# Patient Record
Sex: Male | Born: 1935 | Race: White | Hispanic: No | Marital: Married | Smoking: Never smoker
Health system: Southern US, Community
[De-identification: ages and names within clinical notes are randomized; demographics above are authoritative.]

## PROBLEM LIST (undated history)

## (undated) DIAGNOSIS — I10 Essential (primary) hypertension: Secondary | ICD-10-CM

## (undated) DIAGNOSIS — I639 Cerebral infarction, unspecified: Secondary | ICD-10-CM

## (undated) DIAGNOSIS — I2510 Atherosclerotic heart disease of native coronary artery without angina pectoris: Secondary | ICD-10-CM

---

## 2005-09-17 ENCOUNTER — Ambulatory Visit: Payer: Self-pay | Admitting: Internal Medicine

## 2007-05-20 ENCOUNTER — Ambulatory Visit: Payer: Self-pay | Admitting: Ophthalmology

## 2007-05-20 ENCOUNTER — Other Ambulatory Visit: Payer: Self-pay

## 2008-05-25 ENCOUNTER — Ambulatory Visit: Payer: Self-pay | Admitting: Internal Medicine

## 2012-08-23 ENCOUNTER — Ambulatory Visit: Payer: Self-pay | Admitting: Internal Medicine

## 2012-08-23 LAB — CBC WITH DIFFERENTIAL/PLATELET
Basophil #: 0 10*3/uL (ref 0.0–0.1)
Basophil %: 0.4 %
HCT: 41.3 % (ref 40.0–52.0)
HGB: 14.2 g/dL (ref 13.0–18.0)
Lymphocyte #: 0.9 10*3/uL — ABNORMAL LOW (ref 1.0–3.6)
Lymphocyte %: 9.4 %
MCH: 34.7 pg — ABNORMAL HIGH (ref 26.0–34.0)
MCV: 101 fL — ABNORMAL HIGH (ref 80–100)
Monocyte #: 0.9 x10 3/mm (ref 0.2–1.0)
Monocyte %: 10.1 %
Neutrophil #: 7.2 10*3/uL — ABNORMAL HIGH (ref 1.4–6.5)
Neutrophil %: 79.5 %
RBC: 4.08 10*6/uL — ABNORMAL LOW (ref 4.40–5.90)
RDW: 13.5 % (ref 11.5–14.5)

## 2012-08-23 LAB — URINALYSIS, COMPLETE
Bacteria: NEGATIVE
Bilirubin,UR: NEGATIVE
Ketone: NEGATIVE
Leukocyte Esterase: NEGATIVE
Nitrite: NEGATIVE
Ph: 6 (ref 4.5–8.0)
Specific Gravity: >=1.03 (ref 1.003–1.030)

## 2012-08-23 LAB — COMPREHENSIVE METABOLIC PANEL
Albumin: 4 g/dL (ref 3.4–5.0)
Alkaline Phosphatase: 62 U/L (ref 50–136)
BUN: 20 mg/dL — ABNORMAL HIGH (ref 7–18)
Bilirubin,Total: 0.9 mg/dL (ref 0.2–1.0)
Calcium, Total: 9.6 mg/dL (ref 8.5–10.1)
Co2: 25 mmol/L (ref 21–32)
Creatinine: 1.32 mg/dL — ABNORMAL HIGH (ref 0.60–1.30)
EGFR (Non-African Amer.): 49 — ABNORMAL LOW
Potassium: 4.1 mmol/L (ref 3.5–5.1)
SGPT (ALT): 29 U/L (ref 12–78)
Sodium: 143 mmol/L (ref 136–145)
Total Protein: 7 g/dL (ref 6.4–8.2)

## 2013-06-19 ENCOUNTER — Ambulatory Visit: Payer: Self-pay | Admitting: Internal Medicine

## 2013-06-19 LAB — COMPREHENSIVE METABOLIC PANEL
ALBUMIN: 3.5 g/dL (ref 3.4–5.0)
ALK PHOS: 47 U/L
AST: 21 U/L (ref 15–37)
Anion Gap: 12 (ref 7–16)
BILIRUBIN TOTAL: 0.7 mg/dL (ref 0.2–1.0)
BUN: 20 mg/dL — ABNORMAL HIGH (ref 7–18)
CHLORIDE: 103 mmol/L (ref 98–107)
CREATININE: 1.12 mg/dL (ref 0.60–1.30)
Calcium, Total: 9 mg/dL (ref 8.5–10.1)
Co2: 23 mmol/L (ref 21–32)
GFR CALC NON AF AMER: 59 — AB
Glucose: 190 mg/dL — ABNORMAL HIGH (ref 65–99)
OSMOLALITY: 283 (ref 275–301)
POTASSIUM: 3.9 mmol/L (ref 3.5–5.1)
SGPT (ALT): 27 U/L (ref 12–78)
SODIUM: 138 mmol/L (ref 136–145)
Total Protein: 6.5 g/dL (ref 6.4–8.2)

## 2013-06-19 LAB — CBC WITH DIFFERENTIAL/PLATELET
BASOS ABS: 0 10*3/uL (ref 0.0–0.1)
Basophil %: 0.6 %
Eosinophil #: 0.1 10*3/uL (ref 0.0–0.7)
Eosinophil %: 1.8 %
HCT: 40.5 % (ref 40.0–52.0)
HGB: 13.5 g/dL (ref 13.0–18.0)
LYMPHS ABS: 0.9 10*3/uL — AB (ref 1.0–3.6)
Lymphocyte %: 14.6 %
MCH: 34.7 pg — AB (ref 26.0–34.0)
MCHC: 33.3 g/dL (ref 32.0–36.0)
MCV: 104 fL — AB (ref 80–100)
MONOS PCT: 11 %
Monocyte #: 0.6 x10 3/mm (ref 0.2–1.0)
NEUTROS PCT: 72 %
Neutrophil #: 4.2 10*3/uL (ref 1.4–6.5)
Platelet: 122 10*3/uL — ABNORMAL LOW (ref 150–440)
RBC: 3.89 10*6/uL — AB (ref 4.40–5.90)
RDW: 13.5 % (ref 11.5–14.5)
WBC: 5.9 10*3/uL (ref 3.8–10.6)

## 2013-06-19 LAB — MAGNESIUM: MAGNESIUM: 2 mg/dL

## 2014-03-01 ENCOUNTER — Ambulatory Visit: Payer: Self-pay | Admitting: Internal Medicine

## 2014-03-01 LAB — CBC WITH DIFFERENTIAL/PLATELET
Basophil #: 0 10*3/uL (ref 0.0–0.1)
Basophil %: 0.6 %
EOS PCT: 1.6 %
Eosinophil #: 0.1 10*3/uL (ref 0.0–0.7)
HCT: 43.9 % (ref 40.0–52.0)
HGB: 14.5 g/dL (ref 13.0–18.0)
LYMPHS ABS: 0.9 10*3/uL — AB (ref 1.0–3.6)
Lymphocyte %: 12.9 %
MCH: 34 pg (ref 26.0–34.0)
MCHC: 33.1 g/dL (ref 32.0–36.0)
MCV: 103 fL — ABNORMAL HIGH (ref 80–100)
MONO ABS: 1.1 x10 3/mm — AB (ref 0.2–1.0)
Monocyte %: 15.7 %
Neutrophil #: 5.1 10*3/uL (ref 1.4–6.5)
Neutrophil %: 69.2 %
PLATELETS: 115 10*3/uL — AB (ref 150–440)
RBC: 4.27 10*6/uL — AB (ref 4.40–5.90)
RDW: 13.9 % (ref 11.5–14.5)
WBC: 7.3 10*3/uL (ref 3.8–10.6)

## 2015-02-02 ENCOUNTER — Ambulatory Visit
Admission: EM | Admit: 2015-02-02 | Discharge: 2015-02-02 | Disposition: A | Discharge status: Home / Self Care | Payer: Medicare Other | Attending: Family Medicine | Admitting: Family Medicine

## 2015-02-02 ENCOUNTER — Ambulatory Visit: Payer: Medicare Other

## 2015-02-02 ENCOUNTER — Encounter: Payer: Self-pay | Admitting: Emergency Medicine

## 2015-02-02 DIAGNOSIS — K5900 Constipation, unspecified: Secondary | ICD-10-CM | POA: Diagnosis not present

## 2015-02-02 HISTORY — DX: Cerebral infarction, unspecified: I63.9

## 2015-02-02 HISTORY — DX: Essential (primary) hypertension: I10

## 2015-02-02 HISTORY — DX: Atherosclerotic heart disease of native coronary artery without angina pectoris: I25.10

## 2015-02-02 LAB — OCCULT BLOOD X 1 CARD TO LAB, STOOL: Fecal Occult Bld: NEGATIVE

## 2015-02-02 NOTE — ED Notes (Signed)
Pt states "a week ago I started throwing up and then I had diarrhea. I flew home Saturday, I felt better, now a week later I have not had a bowel movement. I have been eating normal and drinking normal. I am urinating normal, but no bowel movement for 6 days. I do not have pain." Pt looks well in color, no distress.

## 2015-02-02 NOTE — Discharge Instructions (Signed)
Drink water regularly. Follow up with your primary care physician next week.   Return to Urgent care for new or worsening concerns.   Constipation, Adult Constipation is when a person has fewer than three bowel movements a week, has difficulty having a bowel movement, or has stools that are dry, hard, or larger than normal. As people grow older, constipation is more common. A low-fiber diet, not taking in enough fluids, and taking certain medicines may make constipation worse.  CAUSES   Certain medicines, such as antidepressants, pain medicine, iron supplements, antacids, and water pills.   Certain diseases, such as diabetes, irritable bowel syndrome (IBS), thyroid disease, or depression.   Not drinking enough water.   Not eating enough fiber-rich foods.   Stress or travel.   Lack of physical activity or exercise.   Ignoring the urge to have a bowel movement.   Using laxatives too much.  SIGNS AND SYMPTOMS   Having fewer than three bowel movements a week.   Straining to have a bowel movement.   Having stools that are hard, dry, or larger than normal.   Feeling full or bloated.   Pain in the lower abdomen.   Not feeling relief after having a bowel movement.  DIAGNOSIS  Your health care provider will take a medical history and perform a physical exam. Further testing may be done for severe constipation. Some tests may include:  A barium enema X-ray to examine your rectum, colon, and, sometimes, your small intestine.   A sigmoidoscopy to examine your lower colon.   A colonoscopy to examine your entire colon. TREATMENT  Treatment will depend on the severity of your constipation and what is causing it. Some dietary treatments include drinking more fluids and eating more fiber-rich foods. Lifestyle treatments may include regular exercise. If these diet and lifestyle recommendations do not help, your health care provider may recommend taking over-the-counter  laxative medicines to help you have bowel movements. Prescription medicines may be prescribed if over-the-counter medicines do not work.  HOME CARE INSTRUCTIONS   Eat foods that have a lot of fiber, such as fruits, vegetables, whole grains, and beans.  Limit foods high in fat and processed sugars, such as french fries, hamburgers, cookies, candies, and soda.   A fiber supplement may be added to your diet if you cannot get enough fiber from foods.   Drink enough fluids to keep your urine clear or pale yellow.   Exercise regularly or as directed by your health care provider.   Go to the restroom when you have the urge to go. Do not hold it.   Only take over-the-counter or prescription medicines as directed by your health care provider. Do not take other medicines for constipation without talking to your health care provider first.  SEEK IMMEDIATE MEDICAL CARE IF:   You have bright red blood in your stool.   Your constipation lasts for more than 4 days or gets worse.   You have abdominal or rectal pain.   You have thin, pencil-like stools.   You have unexplained weight loss. MAKE SURE YOU:   Understand these instructions.  Will watch your condition.  Will get help right away if you are not doing well or get worse.   This information is not intended to replace advice given to you by your health care provider. Make sure you discuss any questions you have with your health care provider.   Document Released: 01/11/2004 Document Revised: 05/05/2014 Document Reviewed: 01/24/2013 Elsevier Interactive Patient  Education 2016 Else[employer] Inc.  High-Fiber Diet Fiber, also called dietary fiber, is a type of carbohydrate found in fruits, vegetables, whole grains, and beans. A high-fiber diet can have many health benefits. Your health care provider may recommend a high-fiber diet to help:  Prevent constipation. Fiber can make your bowel movements more regular.  Lower your  cholesterol.  Relieve hemorrhoids, uncomplicated diverticulosis, or irritable bowel syndrome.  Prevent overeating as part of a weight-loss plan.  Prevent heart disease, type 2 diabetes, and certain cancers. WHAT IS MY PLAN? The recommended daily intake of fiber includes:  38 grams for men under age 63.  30 grams for men over age 21.  25 grams for women under age 79.  21 grams for women over age 48. You can get the recommended daily intake of dietary fiber by eating a variety of fruits, vegetables, grains, and beans. Your health care provider may also recommend a fiber supplement if it is not possible to get enough fiber through your diet. WHAT DO I NEED TO KNOW ABOUT A HIGH-FIBER DIET?  Fiber supplements have not been widely studied for their effectiveness, so it is better to get fiber through food sources.  Always check the fiber content on thenutrition facts label of any prepackaged food. Look for foods that contain at least 5 grams of fiber per serving.  Ask your dietitian if you have questions about specific foods that are related to your condition, especially if those foods are not listed in the following section.  Increase your daily fiber consumption gradually. Increasing your intake of dietary fiber too quickly may cause bloating, cramping, or gas.  Drink plenty of water. Water helps you to digest fiber. WHAT FOODS CAN I EAT? Grains Whole-grain breads. Multigrain cereal. Oats and oatmeal. Brown rice. Barley. Bulgur wheat. Millet. Bran muffins. Popcorn. Rye wafer crackers. Vegetables Sweet potatoes. Spinach. Kale. Artichokes. Cabbage. Broccoli. Green peas. Carrots. Squash. Fruits Berries. Pears. Apples. Oranges. Avocados. Prunes and raisins. Dried figs. Meats and Other Protein Sources Navy, kidney, pinto, and soy beans. Split peas. Lentils. Nuts and seeds. Dairy Fiber-fortified yogurt. Beverages Fiber-fortified soy milk. Fiber-fortified orange juice. Other Fiber  bars. The items listed above may not be a complete list of recommended foods or beverages. Contact your dietitian for more options. WHAT FOODS ARE NOT RECOMMENDED? Grains White bread. Pasta made with refined flour. White rice. Vegetables Fried potatoes. Canned vegetables. Well-cooked vegetables.  Fruits Fruit juice. Cooked, strained fruit. Meats and Other Protein Sources Fatty cuts of meat. Fried Environmental education officer or fried fish. Dairy Milk. Yogurt. Cream cheese. Sour cream. Beverages Soft drinks. Other Cakes and pastries. Butter and oils. The items listed above may not be a complete list of foods and beverages to avoid. Contact your dietitian for more information. WHAT ARE SOME TIPS FOR INCLUDING HIGH-FIBER FOODS IN MY DIET?  Eat a wide variety of high-fiber foods.  Make sure that half of all grains consumed each day are whole grains.  Replace breads and cereals made from refined flour or white flour with whole-grain breads and cereals.  Replace white rice with brown rice, bulgur wheat, or millet.  Start the day with a breakfast that is high in fiber, such as a cereal that contains at least 5 grams of fiber per serving.  Use beans in place of meat in soups, salads, or pasta.  Eat high-fiber snacks, such as berries, raw vegetables, nuts, or popcorn.   This information is not intended to replace advice given to you by your health  care provider. Make sure you discuss any questions you have with your health care provider.   Document Released: 04/14/2005 Document Revised: 05/05/2014 Document Reviewed: 09/27/2013 Elsevier Interactive Patient Education [employer]

## 2015-02-02 NOTE — ED Provider Notes (Signed)
[facility] Emergency Department Provider Note  ____________________________________________  Time seen: Approximately 1:15 PM  I have reviewed the triage vital signs and the nursing notes.   HISTORY  Chief Complaint Constipation    HPI Aaron Sparks is a 81 y.o. male presents with a complaint of constipation. Patient reports that he is been constipated 6 days. States this past Friday and Saturday he was traveling in [place] at a conference and states that he felt that he ate something but it did not agree with him. Patient states that while at the conference he had several episodes of diarrhea as well as 2-3 episodes of vomiting. States that those symptoms quickly resolved within a day. States he came home Sunday. States since Sunday has been eating and drinking well. States that he is eating his normal amount of foods. Denies any appetite changes. States however he has not had a bowel movement. States that he normally has a bowel movement every other day. States the vomiting and diarrhea was normal colored, and states he was seen by a physician at the conference who confirmed the vomiting as well as stool were negative for blood present.   Patient does report that after having the diarrhea episodes he stopped his Colace stool softener and did not take it again until yesterday. Patient states that he does continue to pass gas. Denies nausea or vomiting since last week. Denies abdominal pain, bloating or other discomfort. Reports continues to urinate well and denies urinary stream changes. Denies rectal pain. Denies rectal bleeding. Denies fever.  Denies other recent sickness. Denies pain.    Past Medical History  Diagnosis Date  . Hypertension   . Coronary artery disease   . TIA     There are no active problems to display for this patient.   History reviewed. No pertinent past surgical history.  Current Outpatient Rx  Name  Route  Sig  Dispense   Refill  . aspirin 81 MG chewable tablet   Oral   Chew by mouth daily.         . calcium citrate (CALCITRATE - DOSED IN MG ELEMENTAL CALCIUM) 950 MG tablet   Oral   Take 200 mg of elemental calcium by mouth daily.         . clopidogrel (PLAVIX) 75 MG tablet   Oral   Take 75 mg by mouth daily.         . simvastatin (ZOCOR) 20 MG tablet   Oral   Take 20 mg by mouth daily.         . vitamin B-12 (CYANOCOBALAMIN) 100 MCG tablet   Oral   Take 500 mcg by mouth daily.         . Vitamin D, Ergocalciferol, (DRISDOL) 50000 UNITS CAPS capsule   Oral   Take 50,000 Units by mouth every 30 (thirty) days.         Colace   Allergies Review of patient's allergies indicates no known allergies.  No family history on file.  Social History Social History  Substance Use Topics  . Smoking status: Never Smoker   . Smokeless tobacco: None  . Alcohol Use: Yes    Review of Systems Constitutional: No fever/chills Eyes: No visual changes. ENT: No sore throat. Cardiovascular: Denies chest pain. Respiratory: Denies shortness of breath. Gastrointestinal: No abdominal pain.  No nausea, no vomiting.  No diarrhea.  Positive constipation. Genitourinary: Negative for dysuria. Musculoskeletal: Negative for back pain. Skin: Negative for rash. Neurological: Negative  for headaches, focal weakness or numbness.  10-point ROS otherwise negative.  ____________________________________________   PHYSICAL EXAM:  VITAL SIGNS: ED Triage Vitals  Enc Vitals Group     BP 02/02/15 1217 145/72 mmHg     Pulse Rate 02/02/15 1217 67     Resp 02/02/15 1217 18     Temp 02/02/15 1217 97.9 F (36.6 C)     Temp Source 02/02/15 1217 Oral     SpO2 02/02/15 1217 100 %     Weight 02/02/15 1217 165 lb (74.844 kg)     Height 02/02/15 1217  (1.778 m)     Head Cir --      Peak Flow --      Pain Score --      Pain Loc --      Pain Edu? --      Excl. in GC? --     Constitutional: Alert and  oriented. Well appearing and in no acute distress. Eyes: Conjunctivae are normal. PERRL. EOMI. Head: Atraumatic.  Nose: No congestion/rhinnorhea.  Mouth/Throat: Mucous membranes are moist.  Oropharynx non-erythematous. Neck: No stridor.  No cervical spine tenderness to palpation. Hematological/Lymphatic/Immunilogical: No cervical lymphadenopathy. Cardiovascular: Normal rate, regular rhythm. Grossly normal heart sounds.  Good peripheral circulation. Respiratory: Normal respiratory effort.  No retractions. Lungs CTAB. Gastrointestinal: Soft and nontender. No distention. Normal Bowel sounds.  No abdominal bruits. No CVA tenderness. Rectal exam: completed with Myriam Jacobson, CMA at bedside. No external hemorrhoids, no rash. Prostate nontender. Normal colored stool. No impaction felt. Patient tolerated well.  Musculoskeletal: No lower or upper extremity tenderness nor edema.  No joint effusions. Bilateral pedal pulses equal and easily palpated.  Neurologic:  Normal speech and language. No gross focal neurologic deficits are appreciated. No gait instability. Skin:   No rash noted. Psychiatric: Mood and affect are normal. Speech and behavior are normal.  ____________________________________________   LABS (all labs ordered are listed, but only abnormal results are displayed)  Labs Reviewed  OCCULT BLOOD X 1 CARD TO LAB, STOOL    RADIOLOGY EXAM: ABDOMEN - 1 VIEW  COMPARISON: CT abdomen and pelvis August 23, 2012  FINDINGS: There is fairly diffuse stool throughout the colon. Colon is not distended with stool, however. The bowel gas pattern is unremarkable without obstruction or free air. There are phleboliths in the pelvis as well as prostatic calculi. There is periarticular osteoporosis.  IMPRESSION: Fairly diffuse stool throughout the colon. No obstruction or free air seen on this supine examination. Calcifications in the lower midline pelvis likely reside within the  prostate.   Electronically Signed By: Bretta Bang III M.D. On: 02/02/2015 13:13  I, Renford Dills, personally viewed and evaluated these images (plain radiographs) as part of my medical decision making.     INITIAL IMPRESSION / ASSESSMENT AND PLAN / ED COURSE  Pertinent labs & imaging results that were available during my care of the patient were reviewed by me and considered in my medical decision making (see chart for details).  Discussed this patient and plan of care with Dr Judd Gaudier who also agrees with plan.   Very well appearing patient, no acute distress. Presents for complaint of constipation x 6 days. Prior to constipation patient had some diarrhea and vomiting which he attributed to food, and reports those symptoms quickly resolved. States after diarrhea he then stopped his daily stool softener colace and didn't resume until yesterday. Denies nausea, vomiting, diarrhea. Denies abdominal pain, distention, or discomfort. Reports continues to pass flatus. Hemoccult negative. Normal  bowel sounds. Abdomen soft and nontender.   Per radiology:KUB abdomen with fairly diffuse stool throughout colon; No obstruction or free air seen on this supine examination. NO impaction seen on xray or palpated on rectal exam . As patient just resumed his stool softener yesterday, counseled to continue. Also counseled regarding water intact as well as fiber intake. Discussed use of OTC fiber supplements to assist. Discussed metamucil to use, and patient reports his wife has some at home. Patient to continue colace, metamucil, water intake, and discussed fiber food choices to help constipation. Discussed follow up with Primary care physician this week. Discussed follow up and return parameters including no resolution or any worsening concerns. Patient verbalized understanding and agreed to plan.   ____________________________________________   FINAL CLINICAL IMPRESSION(S) / ED DIAGNOSES  Final  diagnoses:  Constipation, unspecified constipation type       Renford Dills, NP 02/02/15 1402  Renford Dills, NP 02/02/15 1610

## 2015-04-28 ENCOUNTER — Ambulatory Visit
Admission: EM | Admit: 2015-04-28 | Discharge: 2015-04-28 | Disposition: A | Discharge status: Home / Self Care | Payer: Medicare Other | Attending: Family Medicine | Admitting: Family Medicine

## 2015-04-28 ENCOUNTER — Encounter: Payer: Self-pay | Admitting: Emergency Medicine

## 2015-04-28 DIAGNOSIS — M5417 Radiculopathy, lumbosacral region: Secondary | ICD-10-CM | POA: Diagnosis not present

## 2015-04-28 DIAGNOSIS — M5432 Sciatica, left side: Secondary | ICD-10-CM

## 2015-04-28 DIAGNOSIS — M21372 Foot drop, left foot: Secondary | ICD-10-CM

## 2015-04-28 NOTE — ED Notes (Signed)
Left leg pain for 10 days

## 2015-04-28 NOTE — ED Provider Notes (Signed)
CSN: [id number]     Arrival date & time 04/28/15  1130 History   First MD Initiated Contact with Patient 04/28/15 1147     Chief Complaint  Patient presents with  . Leg Pain   (Consider location/radiation/quality/duration/timing/severity/associated sxs/prior Treatment) HPI   This a very pleasant active 81 year old gentleman who is accompanied by his wife. He presents with a left-sided leg pain started approximately 10 days ago but became worse today. 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 He has several trips yearly and has stopped growing internationally. He recently returned about 3 weeks ago from a trip to [place] lasting 3 days. He does not remember specifically injuring himself but noticed that shortly afterwards began to experience the left leg pain where he indicates begins in the left buttock cheek will radiate into his left thigh calf and ankle but varies in intensity and location. It is worse with ambulation and is relieved by sitting or rest. He is use K cane today for ambulation. Although he denies any incontinence he has been using a pad recently for some leakage of bowel. Coughing sneezing or straining at bowel movement does not increase his discomfort. He states that he normally would have numbness in his leg and previously diagnosed radiculopathy and a left foot drop but the numbness has now turned into the pain.  Past Medical History  Diagnosis Date  . Hypertension   . Coronary artery disease   . CVA (cerebral infarction)    History reviewed. No pertinent past surgical history. No family history on file. Social History  Substance Use Topics  . Smoking status: Never Smoker   . Smokeless tobacco: None  . Alcohol Use: Yes    Review of Systems  Constitutional: Positive for activity change. Negative for fever, chills, diaphoresis and fatigue.  Musculoskeletal: Positive for myalgias and gait problem.  Skin:  Negative for color change and rash.  Neurological: Positive for numbness.    Allergies  Review of patient's allergies indicates no known allergies.  Home Medications   Prior to Admission medications   Medication Sig Start Date End Date Taking? Authorizing Provider  aspirin 81 MG chewable tablet Chew by mouth daily.    Historical Provider, MD  calcium citrate (CALCITRATE - DOSED IN MG ELEMENTAL CALCIUM) 950 MG tablet Take 200 mg of elemental calcium by mouth daily.    Historical Provider, MD  clopidogrel (PLAVIX) 75 MG tablet Take 75 mg by mouth daily.    Historical Provider, MD  simvastatin (ZOCOR) 20 MG tablet Take 20 mg by mouth daily.    Historical Provider, MD  vitamin B-12 (CYANOCOBALAMIN) 100 MCG tablet Take 500 mcg by mouth daily.    Historical Provider, MD  Vitamin D, Ergocalciferol, (DRISDOL) 50000 UNITS CAPS capsule Take 50,000 Units by mouth every 30 (thirty) days.    Historical Provider, MD   Meds Ordered and Administered this Visit  Medications - No data to display  BP 142/66 mmHg  Pulse 110  Temp(Src) 97.7 F (36.5 C) (Tympanic)  Resp 16  Ht  (1.778 m)  Wt 165 lb (74.844 kg)  BMI 23.68 kg/m2  SpO2 96% No data found.   Physical Exam  Constitutional: He is oriented to person, place, and time. He appears well-developed and well-nourished. No distress.  HENT:  Head: Normocephalic and atraumatic.  Eyes: Conjunctivae are normal. Pupils are equal, round, and reactive to light.  Neck: Normal range of motion. Neck supple.  Musculoskeletal:  Examination  lumbar spine shows a level pelvis in stance. For flexion shows the patient able to bend forward to touch his fingers to the top of his feet. Flexion to the left showed blunting of the lumbar curve but the right is much smoother. There is no tenderness over the sacral iliac joint but there is tenderness to deep palpation in the sciatic notch on the left. This is not present on the right. Patient has difficulty on toe  and heel walking due to his foot drop on the left. Sensation is intact to light touch in the lower extremities bilaterally. Large 2+ over 4 and symmetrical. Her leg raise testing causes him to have some posterior thigh and lateral calf discomfort. There is also tenderness along these areas. There is no significant calf swelling or warmth or pain with compression.  Neurological: He is alert and oriented to person, place, and time.  Skin: Skin is warm and dry. He is not diaphoretic.  Psychiatric: He has a normal mood and affect. His behavior is normal. Judgment and thought content normal.  Nursing note and vitals reviewed.   ED Course  Procedures (including critical care time)  Labs Review Labs Reviewed - No data to display  Imaging Review No results found.   Visual Acuity Review  Right Eye Distance:   Left Eye Distance:   Bilateral Distance:    Right Eye Near:   Left Eye Near:    Bilateral Near:         MDM   1. Left sided sciatica   2. Left lumbosacral radiculopathy   3. Foot drop, left    Discharge Medication List as of 04/28/2015 12:33 PM    Plan: 1. Diagnosis reviewed with patient 2. rx as per orders; risks, benefits, potential side effects reviewed with patient 3. Recommend supportive treatment with rest and symptom avoidance. He does wear an AFO for a left foot drop which is chronic. Research of his previous records he does have a history of left-sided radiculopathy as well. 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 Is feasible that he injured his back at during his most recent trip to [place] now has the subsequent sciatica. Because of his age we will treat him with that nostril anti-inflammatories over-the-counter 400 ibuprofen 3 times a day with food. He may need to consider slowing his schedule and he states [place] will take this under consideration. I recommended  that he follow-up with Dr. Audelia Actonheis next week for follow-up and to discuss ending his schedule in the near future. His wife states that she has been trying to get him to slow down but it is a passion with him. He has any further problems or worsening recommended he be seen in emergency department. 4. F/u prn if symptoms worsen or don't improve     Lutricia FeilWilliam P Kwaku Mostafa, PA-C 04/28/15 1248

## 2015-04-28 NOTE — Discharge Instructions (Signed)

## 2015-07-12 ENCOUNTER — Other Ambulatory Visit: Payer: Self-pay | Admitting: Orthopedic Surgery

## 2015-07-12 DIAGNOSIS — M5416 Radiculopathy, lumbar region: Secondary | ICD-10-CM

## 2015-07-31 ENCOUNTER — Ambulatory Visit: Admission: RE | Admit: 2015-07-31 | Payer: Medicare Other | Source: Ambulatory Visit

## 2015-08-08 ENCOUNTER — Other Ambulatory Visit: Payer: Self-pay | Admitting: Orthopedic Surgery

## 2015-08-08 DIAGNOSIS — M5442 Lumbago with sciatica, left side: Secondary | ICD-10-CM

## 2015-08-29 ENCOUNTER — Ambulatory Visit
Admission: RE | Admit: 2015-08-29 | Discharge: 2015-08-29 | Disposition: A | Discharge status: Home / Self Care | Payer: Medicare Other | Source: Ambulatory Visit | Attending: Orthopedic Surgery | Admitting: Orthopedic Surgery

## 2015-08-29 DIAGNOSIS — M4726 Other spondylosis with radiculopathy, lumbar region: Secondary | ICD-10-CM | POA: Diagnosis not present

## 2015-08-29 DIAGNOSIS — M5442 Lumbago with sciatica, left side: Secondary | ICD-10-CM | POA: Insufficient documentation

## 2015-08-29 DIAGNOSIS — M4806 Spinal stenosis, lumbar region: Secondary | ICD-10-CM | POA: Insufficient documentation

## 2016-10-17 IMAGING — MR MR LUMBAR SPINE W/O CM
5 series · 35 of 48 positions shown · non-contrast
Comparison: MRI lumbar spine 05/17/2008.

CLINICAL DATA: Low back and left leg pain, chronic. No known
injury. Initial encounter.

EXAM:
MRI LUMBAR SPINE WITHOUT CONTRAST
TECHNIQUE: Multiplanar, multisequence MR imaging of the lumbar spine was
performed. No intravenous contrast was administered.

[Series 2: T2 · sagittal · 4.0mm · 0.81mm/px · 6 of 15 slices shown (1 of 2)]
[im 1/15]
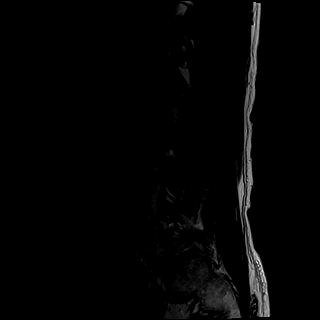
[im 3/15]
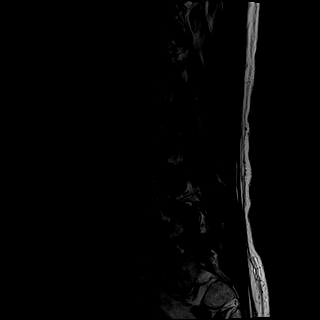
[im 6/15]
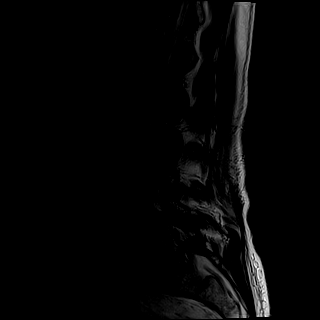
[im 9/15]
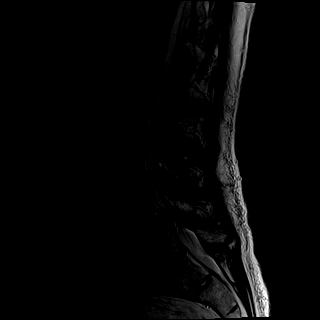
[im 12/15]
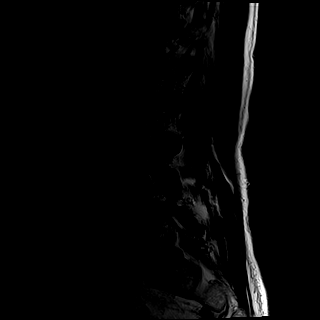
[im 15/15]
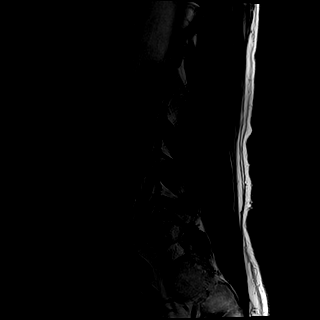

[Series 3: T1 · sagittal · 4.0mm · 0.81mm/px · 6 of 15 slices shown (1 of 2)]
[im 1/15]
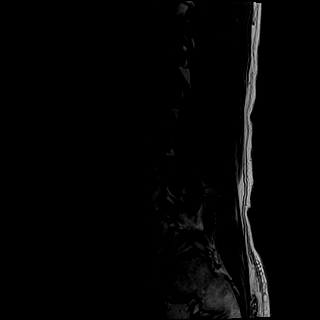
[im 3/15]
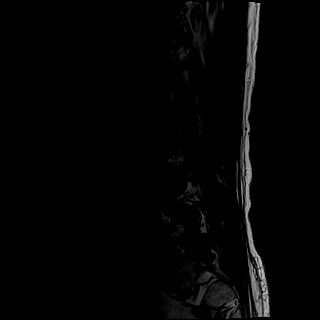
[im 6/15]
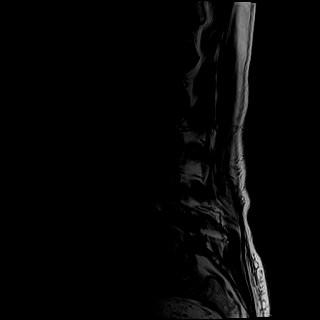
[im 9/15]
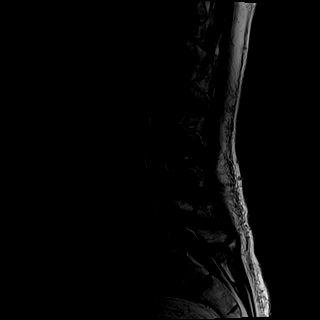
[im 12/15]
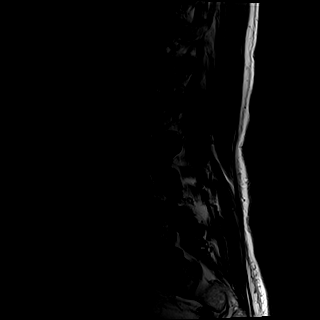
[im 15/15]
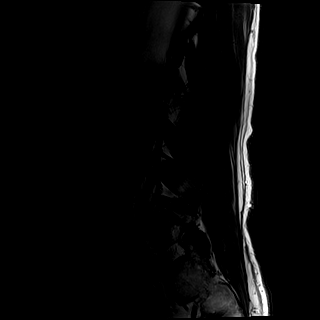

[Series 4: STIR · sagittal · 4.0mm · 1.02mm/px · 5 of 15 slices shown]
[im 1/15]
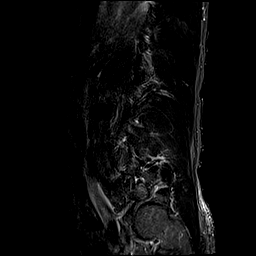
[im 3/15]
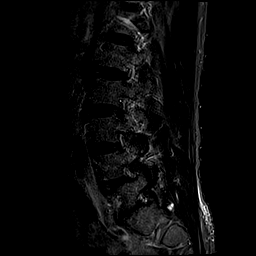
[im 6/15]
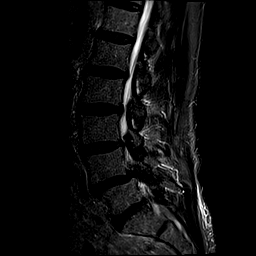
[im 9/15]
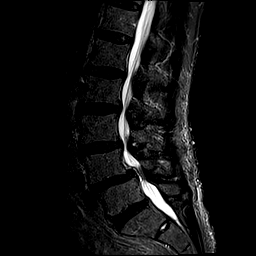
[im 12/15]
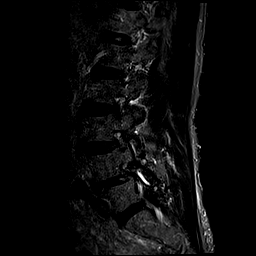

[Series 5: T2 · axial · 4.0mm · 0.78mm/px · z∈[-81,+164]mm · 9 of 40 slices shown (2 of 2)]
[im 1/40]
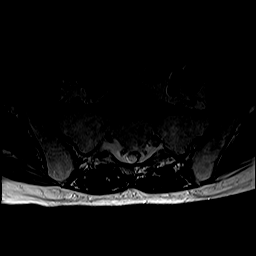
[im 6/40]
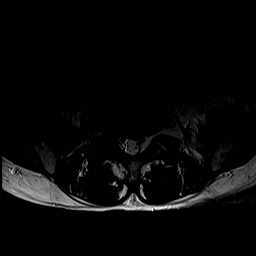
[im 12/40]
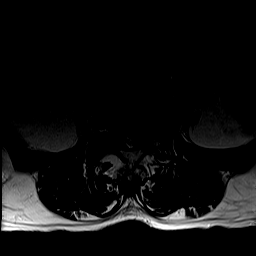
[im 17/40]
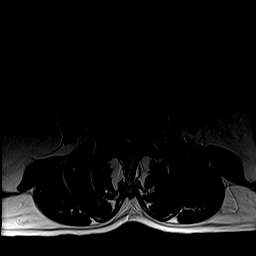
[im 20/40]
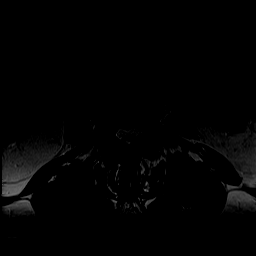
[im 23/40]
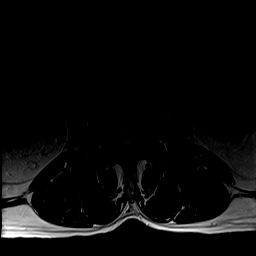
[im 28/40]
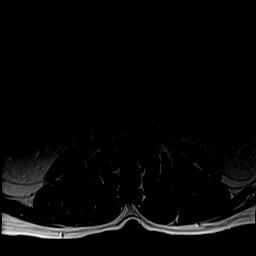
[im 34/40]
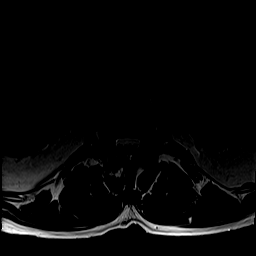
[im 40/40]
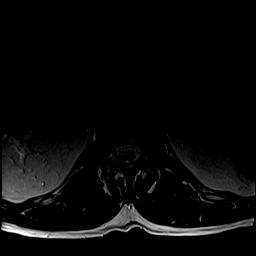

[Series 6: T1 · axial · 4.0mm · 0.39mm/px · z∈[-81,+164]mm · 9 of 40 slices shown (2 of 2)]
[im 1/40]
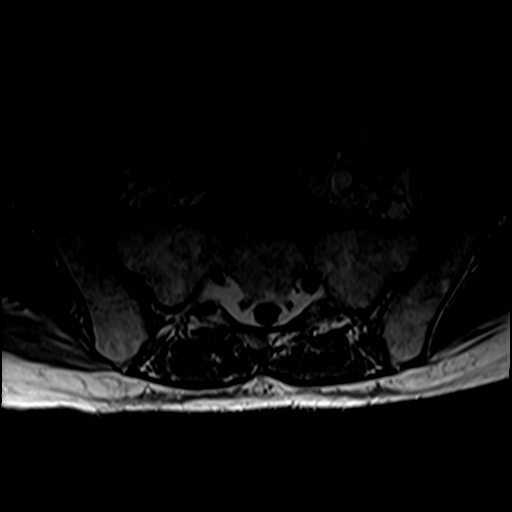
[im 6/40]
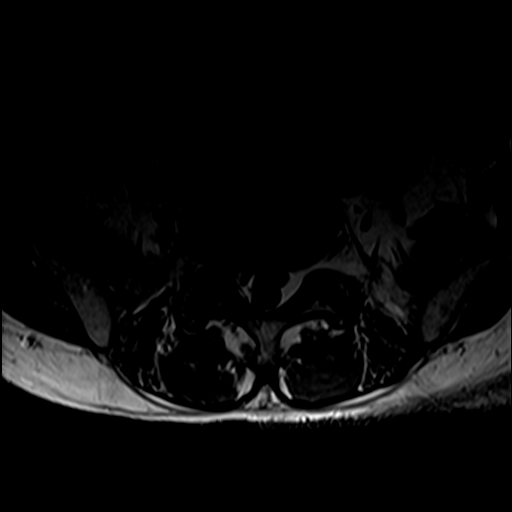
[im 12/40]
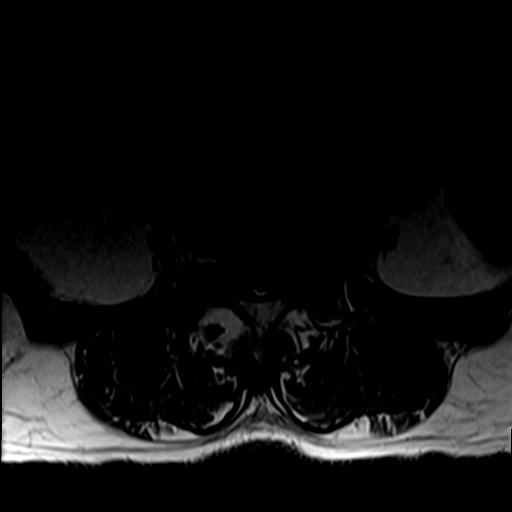
[im 17/40]
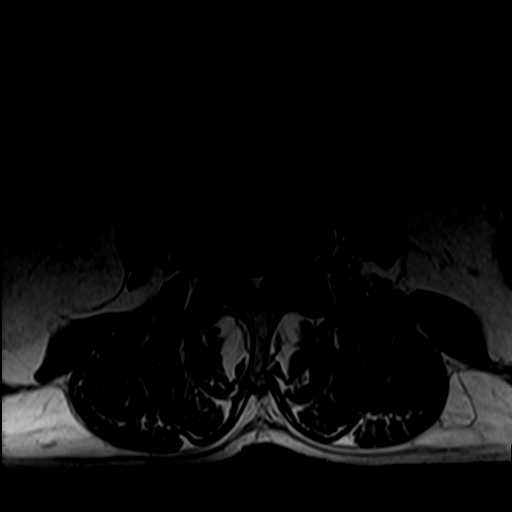
[im 20/40]
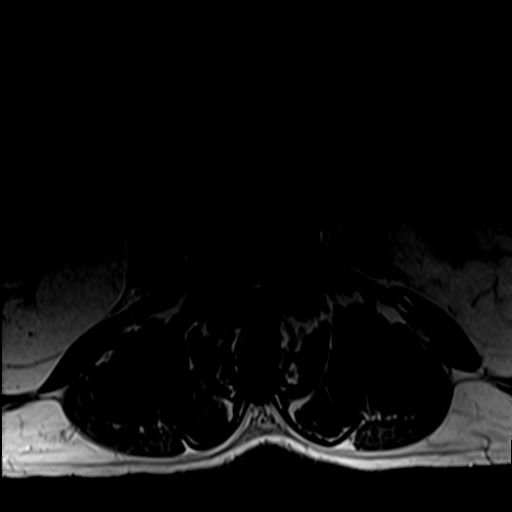
[im 23/40]
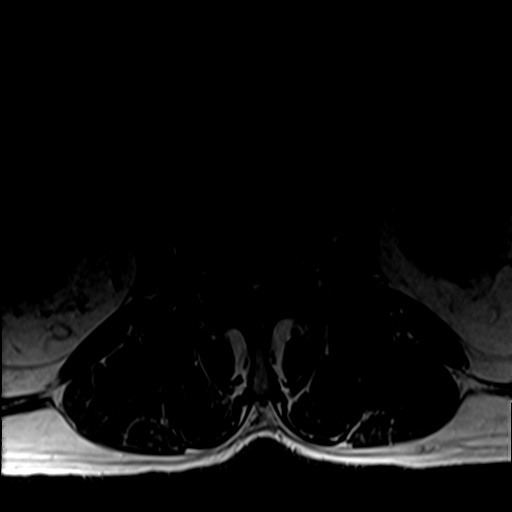
[im 28/40]
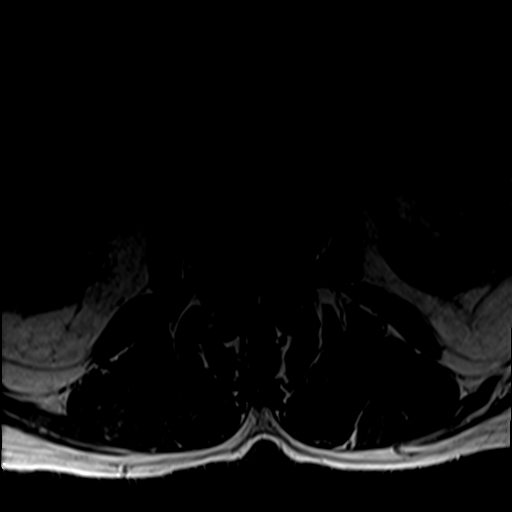
[im 34/40]
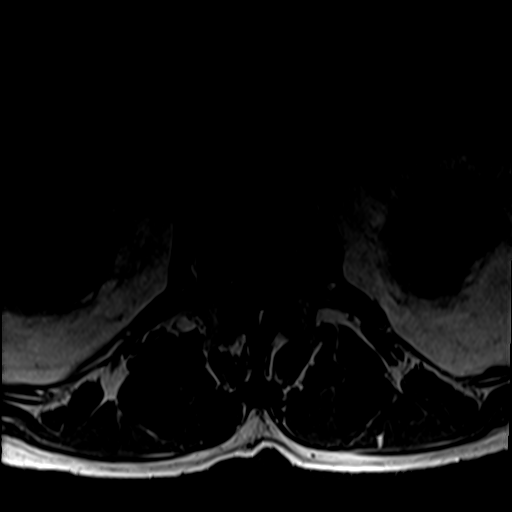
[im 40/40]
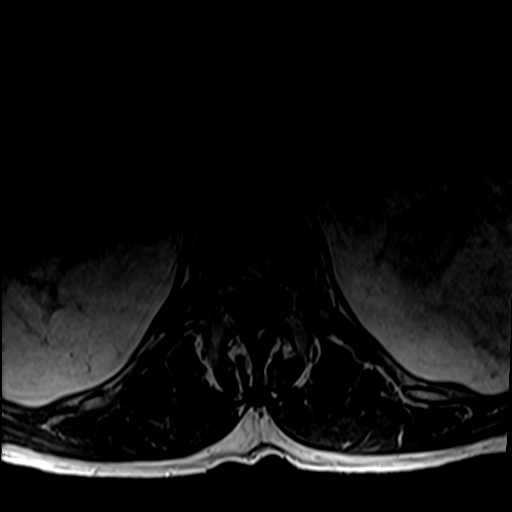

[35 of 48 positions shown; findings below may reference images not displayed]

FINDINGS: Vertebral body height is maintained. 0.9 cm anterolisthesis L4 on L5
due to facet degenerative disease has increased from 0.5 cm on the
prior examination. 0.3 cm anterolisthesis L3 on L4 is unchanged.
Alignment is otherwise normal. The patient has a congenitally narrow
central canal due to short pedicle length. The conus medullaris is
normal in signal and position. Imaged intra-abdominal contents are
unremarkable.

T11-12 is imaged in sagittal plane only and negative.

T12-L1:  Negative.

L1-2:  Negative.

L2-3: Ligamentum flavum thickening and a broad-based disc bulge
result in moderately severe central canal stenosis and narrowing of
both lateral recesses in conjunction with short pedicle length.
Moderate bilateral foraminal narrowing is also identified.
Degenerative change appears worse than on the prior exam.

L3-4: Ligamentum flavum thickening and a shallow disc bulge are
identified. Severe central canal stenosis is present with marked
narrowing of both lateral recesses. The foramina are open.
Degenerative change appears worse than on the prior exam.

L4-5: Previously seen extruded disc fragment extending cephalad in
the left lateral recess is no longer present. Advanced facet
degenerative change is present. There is bulky ligamentum flavum
thickening. The disc is uncovered and bulging. A synovial cyst off
the inferomedial margin of the left facet joint measures 0.9 cm in
diameter. There is severe central canal, bilateral lateral recess
and left foraminal narrowing. Moderate right foraminal narrowing is
present.

L5-S1: Mild to moderate facet degenerative change and a shallow disc
bulge without central canal or foraminal narrowing. Moderate right
foraminal narrowing is identified.
IMPRESSION: Although a previously seen large extruded disc fragment extending
cephalad out of the L4-5 disc interspace has resolved. Spondylosis
at L4-5 has worsened with increased anterolisthesis identified.
Severe congenital and acquired central canal and bilateral lateral
recess stenosis are seen at this level. The left foramen is severely
narrowed.

Progressive, severe congenital and acquired central canal stenosis
L3-4.

Some increase in moderate left foraminal narrowing at L5-S1.

Increase moderately severe congenital and acquired central canal
stenosis L2-3.
